# Patient Record
Sex: Male | Born: 1960 | Hispanic: Yes | Marital: Married
Health system: Southern US, Community
[De-identification: ages and names within clinical notes are randomized; demographics above are authoritative.]

---

## 2005-06-02 ENCOUNTER — Encounter: Admission: RE | Admit: 2005-06-02 | Discharge: 2005-06-02 | Payer: Self-pay | Admitting: Family Medicine

## 2005-12-05 ENCOUNTER — Encounter: Admission: RE | Admit: 2005-12-05 | Discharge: 2005-12-05 | Payer: Self-pay | Admitting: Family Medicine

## 2006-01-11 ENCOUNTER — Ambulatory Visit: Payer: Self-pay | Admitting: Family Medicine

## 2006-01-28 ENCOUNTER — Encounter: Admission: RE | Admit: 2006-01-28 | Discharge: 2006-01-28 | Payer: Self-pay | Admitting: Family Medicine

## 2007-01-29 ENCOUNTER — Ambulatory Visit: Payer: Self-pay | Admitting: Family Medicine

## 2007-01-29 ENCOUNTER — Telehealth (INDEPENDENT_AMBULATORY_CARE_PROVIDER_SITE_OTHER): Payer: Self-pay | Admitting: *Deleted

## 2007-01-29 ENCOUNTER — Ambulatory Visit: Payer: Self-pay | Admitting: Cardiovascular Disease

## 2007-01-29 DIAGNOSIS — R1032 Left lower quadrant pain: Secondary | ICD-10-CM

## 2007-01-29 DIAGNOSIS — K5289 Other specified noninfective gastroenteritis and colitis: Secondary | ICD-10-CM

## 2007-01-29 LAB — CONVERTED CEMR LAB
Basophils Relative: 0 % (ref 0.0–1.0)
Hemoglobin: 15.4 g/dL (ref 13.0–17.0)
Monocytes Absolute: 0.6 10*3/uL (ref 0.2–0.7)
Monocytes Relative: 13.2 % — ABNORMAL HIGH (ref 3.0–11.0)
Neutro Abs: 2.7 10*3/uL (ref 1.4–7.7)
Neutrophils Relative %: 59.8 % (ref 43.0–77.0)
RBC: 4.87 M/uL (ref 4.22–5.81)
RDW: 12.1 % (ref 11.5–14.6)
WBC: 4.5 10*3/uL (ref 4.5–10.5)

## 2007-02-01 ENCOUNTER — Ambulatory Visit: Payer: Self-pay | Admitting: Family Medicine

## 2008-06-13 IMAGING — CT CT PELVIS W/ CM
2 of 5 series · 17 of 46 positions shown, 19 images · IV contrast (Omnipaque 300)
Comparison: MR abdomen 01/28/2006

CLINICAL DATA: Left lower quadrant pain and diarrhea.  Vomiting. 
 ABDOMEN CT WITH CONTRAST:
TECHNIQUE: Multidetector CT imaging of the abdomen was performed following the standard protocol during bolus administration of intravenous contrast.
 Contrast:  125 cc Omnipaque 300
TECHNIQUE: Multidetector CT imaging of the pelvis was performed following the standard protocol during bolus administration of intravenous contrast.

[Series 2: abd_pel 5.0 b30f st · axial · 0.70mm/px · z∈[-476,-132]mm · 14 of 77 slices shown, 16 images]
[im 4/77  soft-tissue]
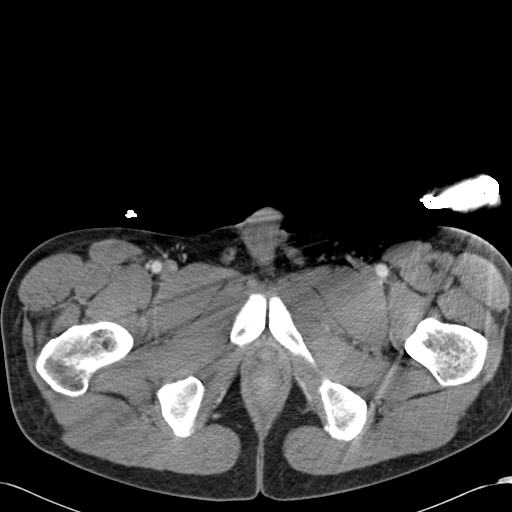
[im 4/77  bone]
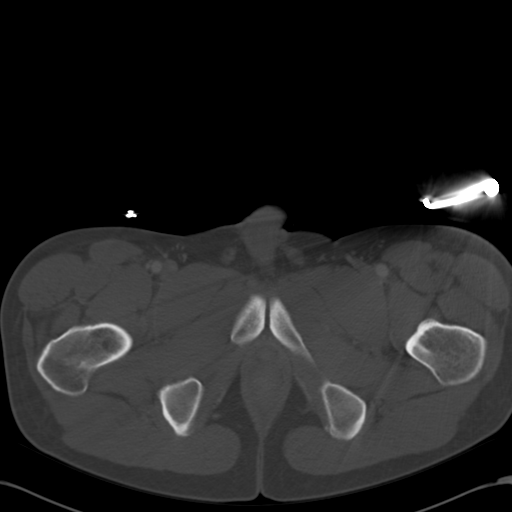
[im 12/77  soft-tissue]
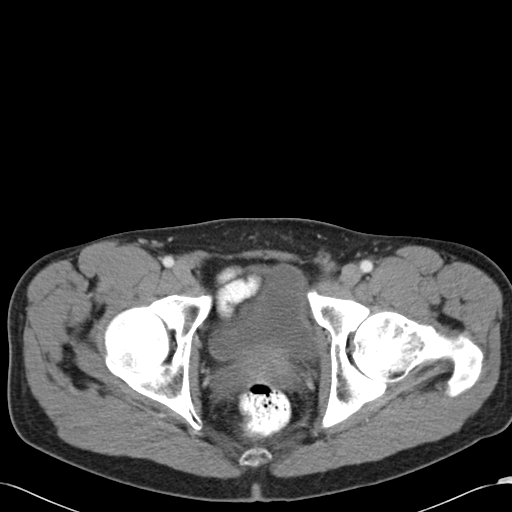
[im 16/77  soft-tissue]
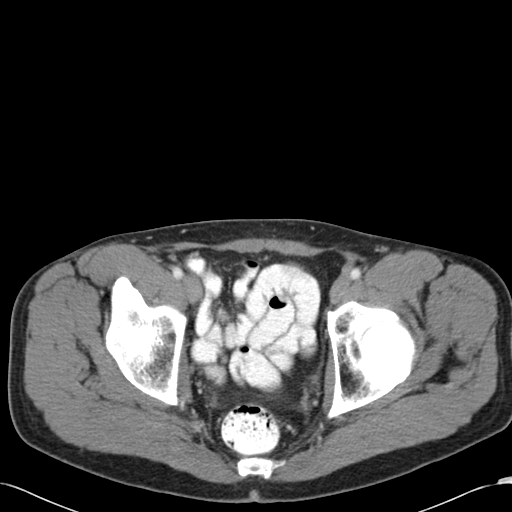
[im 20/77  soft-tissue]
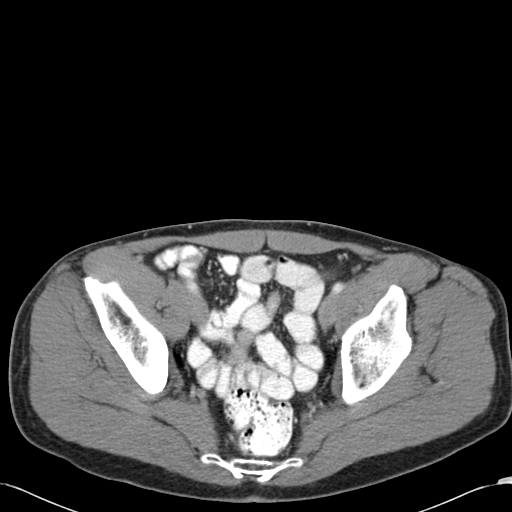
[im 27/77  soft-tissue]
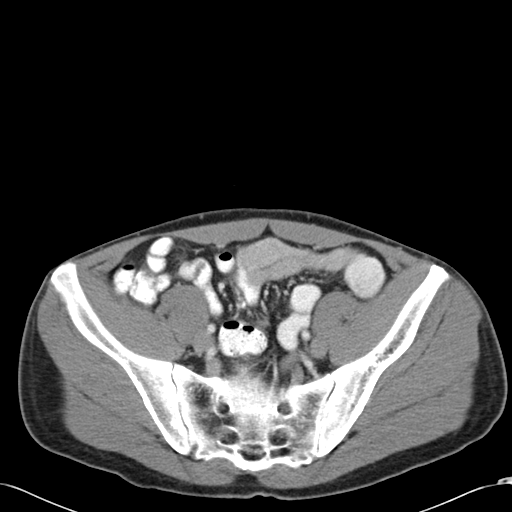
[im 31/77  soft-tissue]
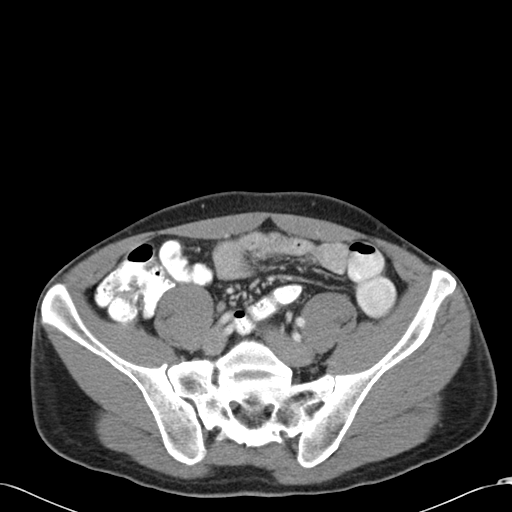
[im 35/77  soft-tissue]
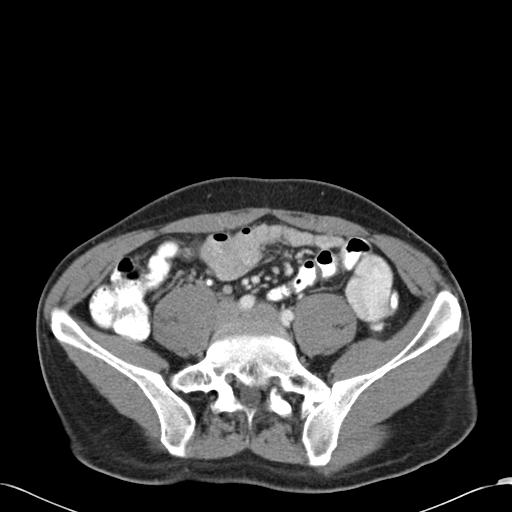
[im 42/77  soft-tissue]
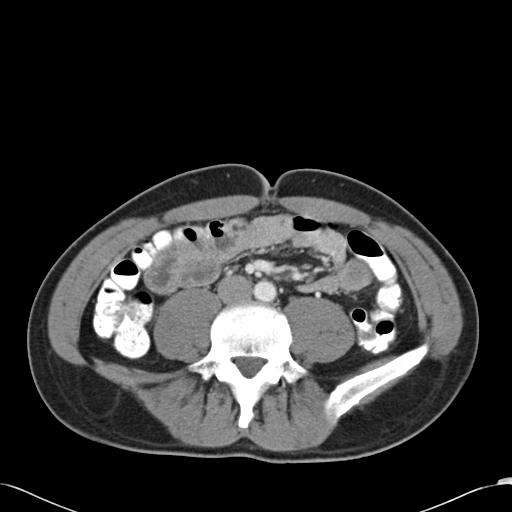
[im 46/77  soft-tissue]
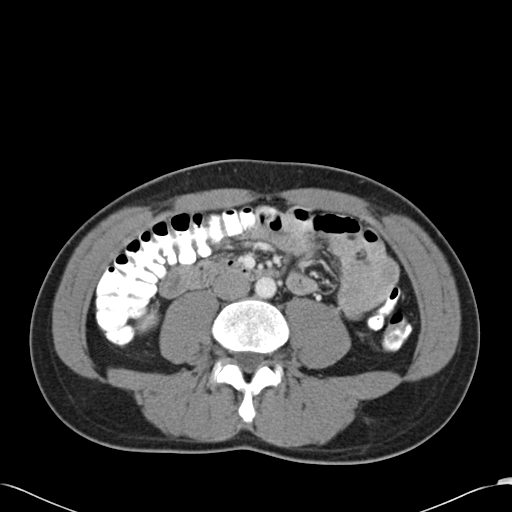
[im 46/77  bone]
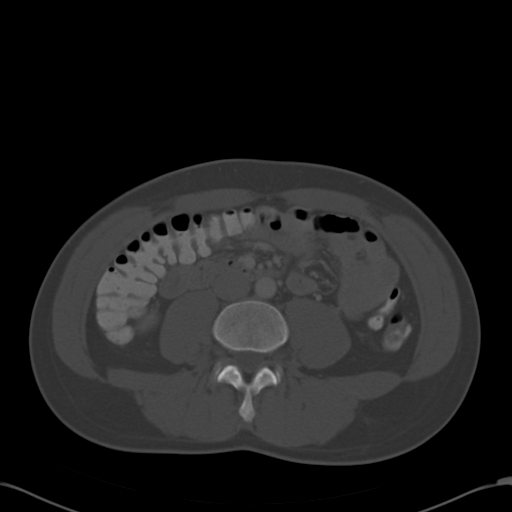
[im 50/77  soft-tissue]
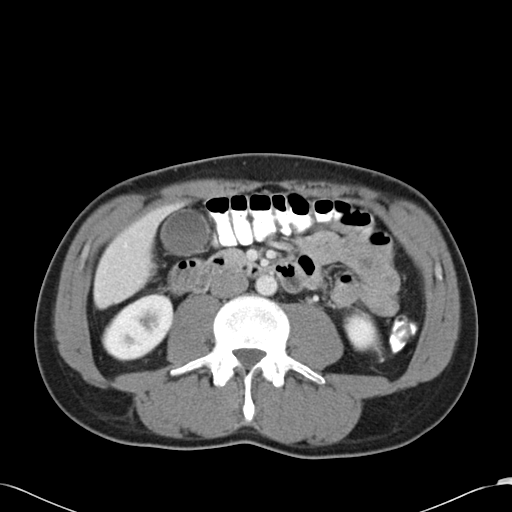
[im 58/77  soft-tissue]
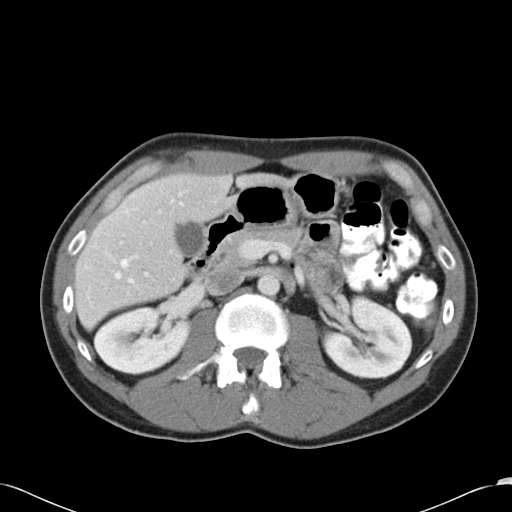
[im 61/77  soft-tissue]
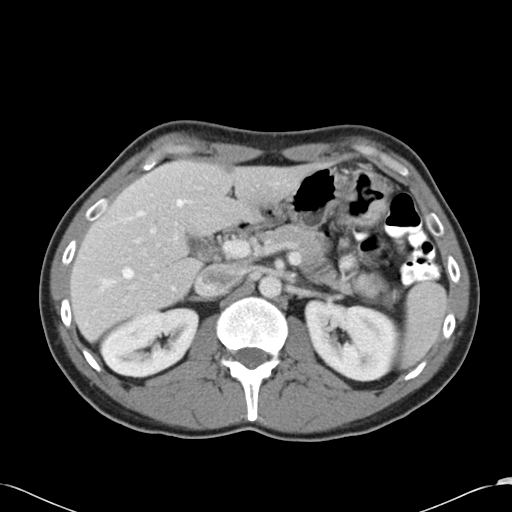
[im 65/77  soft-tissue]
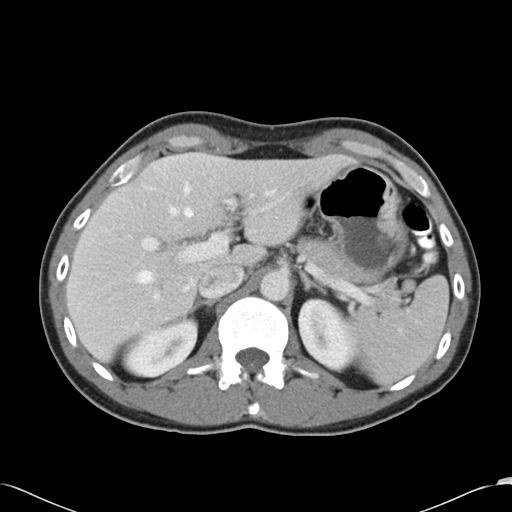
[im 73/77  soft-tissue]
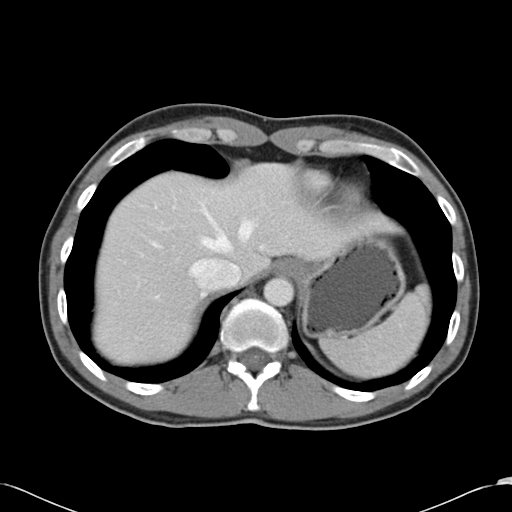

[Series 602: <mpr thick range> · coronal · 0.77mm/px · 3 of 84 slices shown]
[im 28/84  soft-tissue]
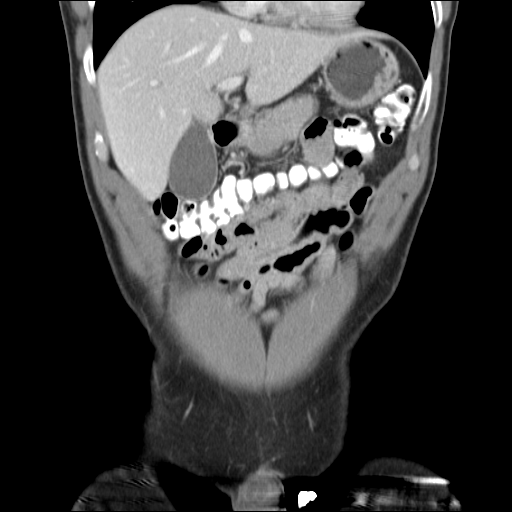
[im 37/84  soft-tissue]
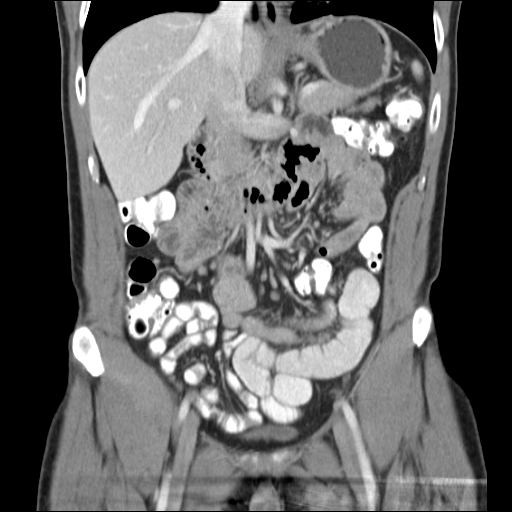
[im 47/84  soft-tissue]
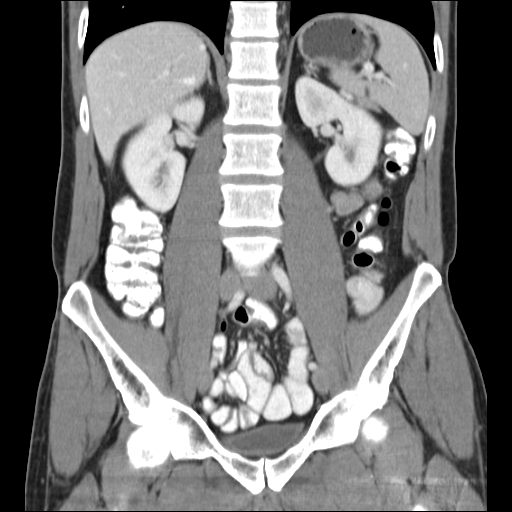

[17 of 46 positions shown; findings below may reference images not displayed]

FINDINGS: Visualized lung bases are clear.  Heart size normal.  No pericardial fluid. 
 Liver, gallbladder, adrenal glands and right kidney unremarkable.  Low attenuation lesions in the left kidney have shown to likely represent cysts on prior studies.   Spleen, pancreas, stomach, and small bowel unremarkable.  Small mesenteric lymph nodes do not meet CT size criteria for pathologic enlargement.
IMPRESSION: No acute findings 
 PELVIS CT WITH CONTRAST:
FINDINGS: Colon and appendix unremarkable.  No free fluid.  No pathologically enlarged lymph nodes.  No worrisome lytic or sclerotic lesions.
IMPRESSION: No acute findings.

## 2010-03-29 ENCOUNTER — Encounter
Admission: RE | Admit: 2010-03-29 | Discharge: 2010-04-27 | Payer: Self-pay | Source: Home / Self Care | Attending: Orthopedic Surgery | Admitting: Orthopedic Surgery

## 2010-04-29 ENCOUNTER — Ambulatory Visit: Payer: Private Health Insurance - Indemnity | Attending: Orthopedic Surgery | Admitting: Physical Therapy

## 2010-04-29 DIAGNOSIS — IMO0001 Reserved for inherently not codable concepts without codable children: Secondary | ICD-10-CM | POA: Insufficient documentation

## 2010-04-29 DIAGNOSIS — M25519 Pain in unspecified shoulder: Secondary | ICD-10-CM | POA: Insufficient documentation

## 2010-04-29 DIAGNOSIS — M25619 Stiffness of unspecified shoulder, not elsewhere classified: Secondary | ICD-10-CM | POA: Insufficient documentation

## 2010-05-04 ENCOUNTER — Ambulatory Visit: Payer: Private Health Insurance - Indemnity | Admitting: Physical Therapy

## 2010-05-06 ENCOUNTER — Ambulatory Visit: Payer: Private Health Insurance - Indemnity | Admitting: Rehabilitation

## 2010-05-11 ENCOUNTER — Ambulatory Visit: Payer: Private Health Insurance - Indemnity | Admitting: Physical Therapy

## 2010-05-13 ENCOUNTER — Ambulatory Visit: Payer: Private Health Insurance - Indemnity | Admitting: Physical Therapy

## 2010-05-18 ENCOUNTER — Ambulatory Visit: Payer: Private Health Insurance - Indemnity | Admitting: Physical Therapy

## 2010-05-18 ENCOUNTER — Encounter: Payer: Private Health Insurance - Indemnity | Admitting: Physical Therapy

## 2019-07-06 ENCOUNTER — Ambulatory Visit: Payer: Self-pay | Attending: Internal Medicine

## 2019-07-06 DIAGNOSIS — Z23 Encounter for immunization: Secondary | ICD-10-CM

## 2019-07-06 NOTE — Progress Notes (Signed)
   Covid-[facility] Vaccination [facility]  Name:  Kiyaan Haq    MRN: [id number] DOB: 01-06-1961  07/06/2019  Mr. Mcjunkins was observed post Covid-19 immunization for 15 minutes without incident. He was provided with Vaccine Information Sheet and instruction to access the V-Safe system.   Mr. Mollenkopf was instructed to call 911 with any severe reactions post vaccine: Marland Kitchen Difficulty breathing  . Swelling of face and throat  . A fast heartbeat  . A bad rash all over body  . Dizziness and weakness   Immunizations Administered    Name Date Dose VIS Date Route   Pfizer COVID-19 Vaccine 07/06/2019  1:02 PM 0.3 mL 03/08/2019 Intramuscular   Manufacturer: [employer], [employer]   Lot: [id number]   NDC: [address]-5

## 2019-07-31 ENCOUNTER — Ambulatory Visit: Payer: Self-pay | Attending: Internal Medicine

## 2019-07-31 DIAGNOSIS — Z23 Encounter for immunization: Secondary | ICD-10-CM

## 2019-07-31 NOTE — Progress Notes (Signed)
   Covid-19 Vaccination Clinic  Name:  Brylen Wagar    MRN: [id number] DOB: 03/17/1961  07/31/2019  Mr. Morandi was observed post Covid-19 immunization for 15 minutes without incident. He was provided with Vaccine Information Sheet and instruction to access the V-Safe system.   Mr. Gastineau was instructed to call 911 with any severe reactions post vaccine: Marland Kitchen Difficulty breathing  . Swelling of face and throat  . A fast heartbeat  . A bad rash all over body  . Dizziness and weakness   Immunizations Administered    Name Date Dose VIS Date Route   Pfizer COVID-19 Vaccine 07/31/2019  8:33 AM 0.3 mL 05/22/2018 Intramuscular   Manufacturer: [employer], [employer]   Lot: [id number]   NDC: [address]-2
